# Patient Record
Sex: Male | Born: 2000 | Race: White | Hispanic: No | Marital: Single | Smoking: Never smoker
Health system: Southern US, Community
[De-identification: ages and names within clinical notes are randomized; demographics above are authoritative.]

## PROBLEM LIST (undated history)

## (undated) HISTORY — PX: WISDOM TOOTH EXTRACTION: SHX21

---
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
## 2022-12-29 ENCOUNTER — Other Ambulatory Visit: Payer: Self-pay

## 2022-12-29 ENCOUNTER — Encounter (HOSPITAL_BASED_OUTPATIENT_CLINIC_OR_DEPARTMENT_OTHER): Payer: Self-pay | Admitting: Emergency Medicine

## 2022-12-29 ENCOUNTER — Emergency Department (HOSPITAL_BASED_OUTPATIENT_CLINIC_OR_DEPARTMENT_OTHER)
Admission: EM | Admit: 2022-12-29 | Discharge: 2022-12-29 | Disposition: A | Discharge status: Home / Self Care | Payer: 59 | Attending: Emergency Medicine | Admitting: Emergency Medicine

## 2022-12-29 DIAGNOSIS — Y92410 Unspecified street and highway as the place of occurrence of the external cause: Secondary | ICD-10-CM | POA: Diagnosis not present

## 2022-12-29 DIAGNOSIS — S59901A Unspecified injury of right elbow, initial encounter: Secondary | ICD-10-CM | POA: Diagnosis present

## 2022-12-29 DIAGNOSIS — S50311A Abrasion of right elbow, initial encounter: Secondary | ICD-10-CM | POA: Diagnosis not present

## 2022-12-29 DIAGNOSIS — R519 Headache, unspecified: Secondary | ICD-10-CM | POA: Insufficient documentation

## 2022-12-29 MED ORDER — IBUPROFEN 600 MG PO TABS: 600.0000 mg | ORAL_TABLET | Freq: Three times a day (TID) | ORAL | 0 refills | Status: AC | PRN

## 2022-12-29 MED ORDER — CYCLOBENZAPRINE HCL 10 MG PO TABS: 10.0000 mg | ORAL_TABLET | Freq: Three times a day (TID) | ORAL | 0 refills | Status: AC | PRN
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
